# Patient Record
Sex: Female | Born: 1980 | Race: White | Hispanic: No | Marital: Single | Smoking: Current every day smoker
Health system: Southern US, Community
[De-identification: ages and names within clinical notes are randomized; demographics above are authoritative.]

## PROBLEM LIST (undated history)

## (undated) HISTORY — PX: WISDOM TOOTH EXTRACTION: SHX21

---

## 2017-07-27 ENCOUNTER — Other Ambulatory Visit: Payer: Self-pay

## 2017-07-27 ENCOUNTER — Telehealth: Payer: Self-pay | Admitting: Emergency Medicine

## 2017-07-27 ENCOUNTER — Encounter: Payer: Self-pay | Admitting: Emergency Medicine

## 2017-07-27 ENCOUNTER — Ambulatory Visit
Admission: EM | Admit: 2017-07-27 | Discharge: 2017-07-27 | Disposition: A | Discharge status: Home / Self Care | Payer: Self-pay | Attending: Family Medicine | Admitting: Family Medicine

## 2017-07-27 DIAGNOSIS — L03211 Cellulitis of face: Secondary | ICD-10-CM

## 2017-07-27 DIAGNOSIS — K047 Periapical abscess without sinus: Secondary | ICD-10-CM

## 2017-07-27 MED ORDER — CEFAZOLIN SODIUM 1 G IJ SOLR
1.0000 g | Freq: Once | INTRAMUSCULAR | Status: AC
  Administered 2017-07-27: 1 g via INTRAMUSCULAR

## 2017-07-27 MED ORDER — PENICILLIN V POTASSIUM 500 MG PO TABS: 500.0000 mg | ORAL_TABLET | Freq: Four times a day (QID) | ORAL | 0 refills | Status: AC

## 2017-07-27 MED ORDER — PENICILLIN V POTASSIUM 500 MG PO TABS: 500.0000 mg | ORAL_TABLET | Freq: Four times a day (QID) | ORAL | 0 refills | Status: DC

## 2017-07-27 NOTE — ED Provider Notes (Signed)
[facility]    CSN: [id number] Arrival date & time: 07/27/17  0846     History   Chief Complaint Chief Complaint  Patient presents with  . Facial Swelling    left side    HPI Rhonda Avery is a 37 y.o. female.   37 yo female with a c/o left sided facial swelling and dental pain. Denies any fevers, chills, vision problems, drainage. Has h/o chronic poor dentition.   The history is provided by the patient.    History reviewed. No pertinent past medical history.  There are no active problems to display for this patient.   Past Surgical History:  Procedure Laterality Date  . WISDOM TOOTH EXTRACTION      OB History   None      Home Medications    Prior to Admission medications   Medication Sig Start Date End Date Taking? Authorizing Provider  levonorgestrel (MIRENA) 20 MCG/24HR IUD 1 each by Intrauterine route once.   Yes [provider]  penicillin v potassium (VEETID) 500 MG tablet Take 1 tablet (500 mg total) by mouth 4 (four) times daily. 07/27/17   Payton Mccallumonty, Ranveer Wahlstrom, MD    Family History History reviewed. No pertinent family history.  Social History Social History   Tobacco Use  . Smoking status: Current Every Day Smoker    Types: Cigarettes  . Smokeless tobacco: Never Used  Substance Use Topics  . Alcohol use: Never    Frequency: Never  . Drug use: Never     Allergies   Patient has no known allergies.   Review of Systems Review of Systems   Physical Exam Triage Vital Signs ED Triage Vitals  Enc Vitals Group     BP 07/27/17 0911 (!) 129/99     Pulse Rate 07/27/17 0911 87     Resp 07/27/17 0911 14     Temp 07/27/17 0911 98.1 F (36.7 C)     Temp Source 07/27/17 0911 Oral     SpO2 07/27/17 0911 100 %     Weight 07/27/17 0907 132 lb (59.9 kg)     Height 07/27/17 0907 5\' 3"  (1.6 m)     Head Circumference --      Peak Flow --      Pain Score 07/27/17 0907 7     Pain Loc --      Pain Edu? --      Excl. in GC? --     No data found.  Updated Vital Signs BP (!) 129/99 (BP Location: Right Arm)   Pulse 87   Temp 98.1 F (36.7 C) (Oral)   Resp 14   Ht 5\' 3"  (1.6 m)   Wt 132 lb (59.9 kg)   SpO2 100%   BMI 23.38 kg/m   Visual Acuity Right Eye Distance:   Left Eye Distance:   Bilateral Distance:    Right Eye Near:   Left Eye Near:    Bilateral Near:     Physical Exam  Constitutional: She appears well-developed and well-nourished. No distress.  HENT:  Mouth/Throat: Oropharynx is clear and moist. Abnormal dentition. Dental caries present. No oropharyngeal exudate, posterior oropharyngeal edema, posterior oropharyngeal erythema or tonsillar abscesses. No tonsillar exudate.  Left cheek erythema and edema  Skin: She is not diaphoretic.  Nursing note and vitals reviewed.    UC Treatments / Results  Labs (all labs ordered are listed, but only abnormal results are displayed) Labs Reviewed - No data to display  EKG None  Radiology No results found.  Procedures Procedures (including critical care time)  Medications Ordered in UC Medications  ceFAZolin (ANCEF) injection 1 g (1 g Intramuscular Given 07/27/17 1006)    Initial Impression / Assessment and Plan / UC Course  I have reviewed the triage vital signs and the nursing notes.  Pertinent labs & imaging results that were available during my care of the patient were reviewed by me and considered in my medical decision making (see chart for details).     Final Clinical Impressions(s) / UC Diagnoses   Final diagnoses:  Facial cellulitis  Dental abscess     Discharge Instructions     Warm compresses If symptoms get worse over the next 24 to 48 hours, recommend going to the hospital Emergency Department    ED Prescriptions    Medication Sig Dispense Auth. Provider   penicillin v potassium (VEETID) 500 MG tablet Take 1 tablet (500 mg total) by mouth 4 (four) times daily. 40 tablet Payton Mccallum, MD     1. diagnosis  reviewed with patient; patient given Ancef 1gm IM x 1  2. rx as per orders above; reviewed possible side effects, interactions, risks and benefits  3. Recommend supportive treatment with otc analgesics prn 4. Go to Emergency Department if no improvement in next 48 hours or if worsening symptoms  5. Follow-up prn    Controlled Substance Prescriptions [place] Controlled Substance Registry consulted? Not Applicable   Payton Mccallum, MD 07/27/17 1250

## 2017-07-27 NOTE — Discharge Instructions (Signed)
Warm compresses If symptoms get worse over the next 24 to 48 hours, recommend going to the hospital Emergency Department

## 2017-07-27 NOTE — ED Triage Notes (Signed)
Patient c/o left sided facial swelling that started yesterday.  Patient reports left sided dental pain.  Patient denies injury or fall. Patient denies fever or chills.

## 2017-11-02 ENCOUNTER — Other Ambulatory Visit: Payer: Self-pay

## 2017-11-02 ENCOUNTER — Emergency Department
Admission: EM | Admit: 2017-11-02 | Discharge: 2017-11-02 | Disposition: A | Discharge status: Home / Self Care | Payer: Self-pay | Attending: Emergency Medicine | Admitting: Emergency Medicine

## 2017-11-02 ENCOUNTER — Emergency Department: Payer: Self-pay

## 2017-11-02 DIAGNOSIS — F17210 Nicotine dependence, cigarettes, uncomplicated: Secondary | ICD-10-CM | POA: Insufficient documentation

## 2017-11-02 DIAGNOSIS — R112 Nausea with vomiting, unspecified: Secondary | ICD-10-CM | POA: Insufficient documentation

## 2017-11-02 DIAGNOSIS — R197 Diarrhea, unspecified: Secondary | ICD-10-CM | POA: Insufficient documentation

## 2017-11-02 DIAGNOSIS — R109 Unspecified abdominal pain: Secondary | ICD-10-CM | POA: Insufficient documentation

## 2017-11-02 LAB — LIPASE, BLOOD: Lipase: 22 U/L (ref 11–51)

## 2017-11-02 LAB — CBC
HCT: 45.6 % (ref 36.0–46.0)
HEMOGLOBIN: 15.2 g/dL — AB (ref 12.0–15.0)
MCH: 31.7 pg (ref 26.0–34.0)
MCHC: 33.3 g/dL (ref 30.0–36.0)
MCV: 95 fL (ref 80.0–100.0)
Platelets: 368 10*3/uL (ref 150–400)
RBC: 4.8 MIL/uL (ref 3.87–5.11)
RDW: 12.7 % (ref 11.5–15.5)
WBC: 12.6 10*3/uL — ABNORMAL HIGH (ref 4.0–10.5)
nRBC: 0 % (ref 0.0–0.2)

## 2017-11-02 LAB — COMPREHENSIVE METABOLIC PANEL
ALT: 23 U/L (ref 0–44)
ANION GAP: 11 (ref 5–15)
AST: 43 U/L — ABNORMAL HIGH (ref 15–41)
Albumin: 4.4 g/dL (ref 3.5–5.0)
Alkaline Phosphatase: 67 U/L (ref 38–126)
BUN: 13 mg/dL (ref 6–20)
CO2: 25 mmol/L (ref 22–32)
Calcium: 9.3 mg/dL (ref 8.9–10.3)
Chloride: 106 mmol/L (ref 98–111)
Creatinine, Ser: 0.77 mg/dL (ref 0.44–1.00)
GFR calc Af Amer: >60 mL/min (ref >60)
Glucose, Bld: 163 mg/dL — ABNORMAL HIGH (ref 70–99)
POTASSIUM: 4.2 mmol/L (ref 3.5–5.1)
Sodium: 142 mmol/L (ref 135–145)
TOTAL PROTEIN: 8.2 g/dL — AB (ref 6.5–8.1)
Total Bilirubin: 0.5 mg/dL (ref 0.3–1.2)

## 2017-11-02 LAB — URINALYSIS, COMPLETE (UACMP) WITH MICROSCOPIC
Bacteria, UA: NONE SEEN
Bilirubin Urine: NEGATIVE
GLUCOSE, UA: NEGATIVE mg/dL
HGB URINE DIPSTICK: NEGATIVE
KETONES UR: 5 mg/dL — AB
Leukocytes, UA: NEGATIVE
NITRITE: NEGATIVE
PROTEIN: 100 mg/dL — AB
Specific Gravity, Urine: 1.026 (ref 1.005–1.030)
pH: 7 (ref 5.0–8.0)

## 2017-11-02 LAB — POCT PREGNANCY, URINE: Preg Test, Ur: NEGATIVE

## 2017-11-02 MED ORDER — ONDANSETRON HCL 4 MG/2ML IJ SOLN
4.0000 mg | Freq: Once | INTRAMUSCULAR | Status: AC | PRN
  Administered 2017-11-02: 4 mg via INTRAVENOUS
  Filled 2017-11-02: qty 2

## 2017-11-02 MED ORDER — SODIUM CHLORIDE 0.9 % IV BOLUS
1000.0000 mL | Freq: Once | INTRAVENOUS | Status: AC
  Administered 2017-11-02: 1000 mL via INTRAVENOUS

## 2017-11-02 MED ORDER — METOCLOPRAMIDE HCL 10 MG PO TABS: 10.0000 mg | ORAL_TABLET | Freq: Three times a day (TID) | ORAL | 0 refills | Status: AC | PRN

## 2017-11-02 MED ORDER — ONDANSETRON HCL 4 MG/2ML IJ SOLN
4.0000 mg | Freq: Once | INTRAMUSCULAR | Status: AC
  Administered 2017-11-02: 4 mg via INTRAVENOUS
  Filled 2017-11-02: qty 2

## 2017-11-02 MED ORDER — METOCLOPRAMIDE HCL 5 MG/ML IJ SOLN
10.0000 mg | Freq: Once | INTRAMUSCULAR | Status: AC
  Administered 2017-11-02: 10 mg via INTRAVENOUS
  Filled 2017-11-02: qty 2

## 2017-11-02 NOTE — ED Notes (Signed)
Patient given sprite at this time.

## 2017-11-02 NOTE — ED Triage Notes (Addendum)
Pt c/o emesis since 3AM. Pt dry heaving at time of triage. Pt asked to stay NPO until seen by EDP. Pt pale. Pt alert and oriented X4, active, cooperative, pt in NAD. RR even and unlabored, color WNL.   Pt continues to drink fluids at time of triage.

## 2017-11-02 NOTE — Discharge Instructions (Addendum)
Return to the ER for abdominal pain especially pain located in the right lower part of your abdomen, fever, or vomiting that is not responsive to the anti nausea medicine. Otherwise follow up with primary care in 2 days for re-evaluation.

## 2017-11-02 NOTE — ED Provider Notes (Signed)
[facility] Emergency Department Provider Note  ____________________________________________  Time seen: Approximately 4:40 PM  I have reviewed the triage vital signs and the nursing notes.   HISTORY  Chief Complaint Emesis   HPI Rhonda Avery is a 37 y.o. female no significant past medical history who presents for evaluation of vomiting, diarrhea.  Patient reports that her symptoms started 3 AM.  She reports several episodes of nonbloody nonbilious emesis.  Has had one episode of watery diarrhea.  No hematemesis, coffee-ground emesis, melena, hematochezia.  She is complaining of mild intermittent cramping abdominal pain.  No pain at this time.  Has had chills but no fever.  No vaginal discharge, no dysuria or hematuria, no chest pain or shortness of breath.  PMH None - reviewed  Past Surgical History:  Procedure Laterality Date  . WISDOM TOOTH EXTRACTION      Prior to Admission medications   Medication Sig Start Date End Date Taking? Authorizing Provider  levonorgestrel (MIRENA) 20 MCG/24HR IUD 1 each by Intrauterine route once.    [provider]  metoCLOPramide (REGLAN) 10 MG tablet Take 1 tablet (10 mg total) by mouth every 8 (eight) hours as needed for up to 3 days for nausea. 11/02/17 11/05/17  Nita Sickle, MD  penicillin v potassium (VEETID) 500 MG tablet Take 1 tablet (500 mg total) by mouth 4 (four) times daily. 07/27/17   Payton Mccallum, MD    Allergies Patient has no known allergies.  No family history on file.  Social History Social History   Tobacco Use  . Smoking status: Current Every Day Smoker    Types: Cigarettes  . Smokeless tobacco: Never Used  Substance Use Topics  . Alcohol use: Never    Frequency: Never  . Drug use: Never    Review of Systems  Constitutional: Negative for fever. Eyes: Negative for visual changes. ENT: Negative for sore throat. Neck: No neck pain  Cardiovascular: Negative for chest  pain. Respiratory: Negative for shortness of breath. Gastrointestinal: + abdominal pain, vomiting and diarrhea. Genitourinary: Negative for dysuria. Musculoskeletal: Negative for back pain. Skin: Negative for rash. Neurological: Negative for headaches, weakness or numbness. Psych: No SI or HI  ____________________________________________   PHYSICAL EXAM:  VITAL SIGNS: ED Triage Vitals [11/02/17 1423]  Enc Vitals Group     BP      Pulse Rate 78     Resp 18     Temp (!) 97.4 F (36.3 C)     Temp Source Oral     SpO2 98 %     Weight 140 lb (63.5 kg)     Height 5\' 3"  (1.6 m)     Head Circumference      Peak Flow      Pain Score 4     Pain Loc      Pain Edu?      Excl. in GC?     Constitutional: Alert and oriented. Well appearing and in no apparent distress. HEENT:      Head: Normocephalic and atraumatic.         Eyes: Conjunctivae are normal. Sclera is non-icteric.       Mouth/Throat: Mucous membranes are moist.       Neck: Supple with no signs of meningismus. Cardiovascular: Regular rate and rhythm. No murmurs, gallops, or rubs. 2+ symmetrical distal pulses are present in all extremities. No JVD. Respiratory: Normal respiratory effort. Lungs are clear to auscultation bilaterally. No wheezes, crackles, or rhonchi.  Gastrointestinal: Soft,  non tender, and non distended with positive bowel sounds. No rebound or guarding. Musculoskeletal: Nontender with normal range of motion in all extremities. No edema, cyanosis, or erythema of extremities. Neurologic: Normal speech and language. Face is symmetric. Moving all extremities. No gross focal neurologic deficits are appreciated. Skin: Skin is warm, dry and intact. No rash noted. Psychiatric: Mood and affect are normal. Speech and behavior are normal.  ____________________________________________   LABS (all labs ordered are listed, but only abnormal results are displayed)  Labs Reviewed  COMPREHENSIVE METABOLIC PANEL -  Abnormal; Notable for the following components:      Result Value   Glucose, Bld 163 (*)    Total Protein 8.2 (*)    AST 43 (*)    All other components within normal limits  CBC - Abnormal; Notable for the following components:   WBC 12.6 (*)    Hemoglobin 15.2 (*)    All other components within normal limits  URINALYSIS, COMPLETE (UACMP) WITH MICROSCOPIC - Abnormal; Notable for the following components:   Color, Urine YELLOW (*)    APPearance HAZY (*)    Ketones, ur 5 (*)    Protein, ur 100 (*)    All other components within normal limits  LIPASE, BLOOD  POC URINE PREG, ED  POCT PREGNANCY, URINE   ____________________________________________  EKG  none  ____________________________________________  RADIOLOGY  I have personally reviewed the images performed during this visit and I agree with the Radiologist's read.   Interpretation by Radiologist:  Dg Abd 2 Views  Result Date: 11/02/2017 CLINICAL DATA:  Nausea and vomiting. EXAM: ABDOMEN - 2 VIEW COMPARISON:  No prior. FINDINGS: Soft tissue structures are unremarkable. Mild gastric distention. Stomach is fluid-filled. Several air-filled loops of nondilated small bowel noted. No free air. No acute bony abnormality identified. IUD noted the pelvis. Pelvic calcifications consistent phleboliths. IMPRESSION: 1.  Mild gastric distention.  Stomach is fluid-filled. 2. Several nondilated air-filled loops of small bowel. No bowel distention or free air. Electronically Signed   By: Maisie Fus  Register   On: 11/02/2017 15:49     ____________________________________________   PROCEDURES  Procedure(s) performed: None Procedures Critical Care performed:  None ____________________________________________   INITIAL IMPRESSION / ASSESSMENT AND PLAN / ED COURSE   37 y.o. female no significant past medical history who presents for evaluation of vomiting, diarrhea.  Patient is extremely well-appearing, no distress, she has normal vital  signs, abdomen is soft with no tenderness throughout and positive bowel sounds.  KUB with no evidence of SBO which I have very low suspicion considering the patient is having bowel movements.  This is most likely viral gastroenteritis.  She does have mild leukocytosis of 12.6 which will go with this diagnosis.  Remainder of her labs showed no acute findings.  Patient will be given IV fluids and Zofran.  She has no abdominal tenderness and a negative pregnancy test therefore low clinical suspicion of appendicitis, ectopic, pregnancy.    _________________________ 6:20 PM on 11/02/2017 -----------------------------------------  Serial abdominal exams with no tenderness.  Patient is tolerating p.o.  Will discharge home with antiemetics and close follow-up with primary care doctor.  Discussed return precautions for abdominal pain especially right lower or right upper quadrant, fever, or any signs of dehydration.   As part of my medical decision making, I reviewed the following data within the electronic MEDICAL RECORD NUMBER Nursing notes reviewed and incorporated, Labs reviewed , Old chart reviewed, Radiograph reviewed , Notes from prior ED visits and [place] Controlled  Substance Database    Pertinent labs & imaging results that were available during my care of the patient were reviewed by me and considered in my medical decision making (see chart for details).    ____________________________________________   FINAL CLINICAL IMPRESSION(S) / ED DIAGNOSES  Final diagnoses:  Nausea and vomiting      NEW MEDICATIONS STARTED DURING THIS VISIT:  ED Discharge Orders         Ordered    metoCLOPramide (REGLAN) 10 MG tablet  Every 8 hours PRN     11/02/17 1817           Note:  This document was prepared using Dragon voice recognition software and may include unintentional dictation errors.    Nita Sickle, MD 11/02/17 [phone]

## 2017-11-24 ENCOUNTER — Other Ambulatory Visit: Payer: Self-pay

## 2017-11-24 ENCOUNTER — Emergency Department
Admission: EM | Admit: 2017-11-24 | Discharge: 2017-11-24 | Disposition: A | Discharge status: Home / Self Care | Payer: Self-pay | Attending: Emergency Medicine | Admitting: Emergency Medicine

## 2017-11-24 ENCOUNTER — Encounter: Payer: Self-pay | Admitting: Emergency Medicine

## 2017-11-24 DIAGNOSIS — K0889 Other specified disorders of teeth and supporting structures: Secondary | ICD-10-CM

## 2017-11-24 DIAGNOSIS — K047 Periapical abscess without sinus: Secondary | ICD-10-CM | POA: Insufficient documentation

## 2017-11-24 DIAGNOSIS — Z79899 Other long term (current) drug therapy: Secondary | ICD-10-CM | POA: Insufficient documentation

## 2017-11-24 DIAGNOSIS — F17210 Nicotine dependence, cigarettes, uncomplicated: Secondary | ICD-10-CM | POA: Insufficient documentation

## 2017-11-24 MED ORDER — HYDROCODONE-ACETAMINOPHEN 5-325 MG PO TABS
1.0000 | ORAL_TABLET | ORAL | Status: AC
  Administered 2017-11-24: 1 via ORAL
  Filled 2017-11-24: qty 1

## 2017-11-24 MED ORDER — CLINDAMYCIN PHOSPHATE 300 MG/2ML IJ SOLN
INTRAMUSCULAR | Status: AC
  Filled 2017-11-24: qty 4

## 2017-11-24 MED ORDER — CLINDAMYCIN PHOSPHATE 600 MG/4ML IJ SOLN
600.0000 mg | Freq: Once | INTRAMUSCULAR | Status: AC
  Administered 2017-11-24: 600 mg via INTRAMUSCULAR

## 2017-11-24 MED ORDER — HYDROCODONE-ACETAMINOPHEN 5-325 MG PO TABS: 1.0000 | ORAL_TABLET | Freq: Four times a day (QID) | ORAL | 0 refills | Status: AC | PRN

## 2017-11-24 MED ORDER — CLINDAMYCIN HCL 300 MG PO CAPS: 300.0000 mg | ORAL_CAPSULE | Freq: Four times a day (QID) | ORAL | 0 refills | Status: AC

## 2017-11-24 NOTE — ED Provider Notes (Signed)
[facility] EMERGENCY DEPARTMENT Provider Note   CSN: [id number] Arrival date & time: 11/24/17  1959     History   Chief Complaint Chief Complaint  Patient presents with  . Dental Problem    HPI Rhonda Avery is a 37 y.o. female.  Presents to the emergency department for evaluation of dental pain and swelling.  Patient has had pain and swelling since this morning.  She describes a soft tissue swelling to the right side of the face with toothache that began early this morning.  She is had a similar infection that began in the left upper area of her mouth but today is in the right lower region.  She does not have a Education officer, community.  She denies having insurance.  Last infection responded well to amoxicillin.  She does admit to having some mild drainage from the right lower tooth.  No trauma or injury.  No fevers.  Tolerating p.o. well.  HPI  History reviewed. No pertinent past medical history.  There are no active problems to display for this patient.   Past Surgical History:  Procedure Laterality Date  . WISDOM TOOTH EXTRACTION       OB History   None      Home Medications    Prior to Admission medications   Medication Sig Start Date End Date Taking? Authorizing Provider  clindamycin (CLEOCIN) 300 MG capsule Take 1 capsule (300 mg total) by mouth 4 (four) times daily for 10 days. 11/24/17 12/04/17  Evon Slack, PA-C  HYDROcodone-acetaminophen (NORCO) 5-325 MG tablet Take 1 tablet by mouth every 6 (six) hours as needed for moderate pain. 11/24/17   Evon Slack, PA-C  levonorgestrel (MIRENA) 20 MCG/24HR IUD 1 each by Intrauterine route once.    [provider]  metoCLOPramide (REGLAN) 10 MG tablet Take 1 tablet (10 mg total) by mouth every 8 (eight) hours as needed for up to 3 days for nausea. 11/02/17 11/05/17  Nita Sickle, MD  penicillin v potassium (VEETID) 500 MG tablet Take 1 tablet (500 mg total) by mouth 4 (four) times daily. 07/27/17    Payton Mccallum, MD    Family History No family history on file.  Social History Social History   Tobacco Use  . Smoking status: Current Every Day Smoker    Types: Cigarettes  . Smokeless tobacco: Never Used  Substance Use Topics  . Alcohol use: Never    Frequency: Never  . Drug use: Never     Allergies   Patient has no known allergies.   Review of Systems Review of Systems  Constitutional: Negative.  Negative for chills and fever.  HENT: Positive for dental problem and facial swelling. Negative for drooling, mouth sores, trouble swallowing and voice change.   Respiratory: Negative for shortness of breath.   Cardiovascular: Negative for chest pain.  Gastrointestinal: Negative for nausea and vomiting.  Musculoskeletal: Negative for arthralgias, neck pain and neck stiffness.  Skin: Negative.   Psychiatric/Behavioral: Negative for confusion.  All other systems reviewed and are negative.    Physical Exam Updated Vital Signs BP 120/85 (BP Location: Left Arm)   Pulse 89   Temp 98.3 F (36.8 C)   Resp 18   Ht 5\' 3"  (1.6 m)   Wt 61.7 kg   SpO2 99%   BMI 24.09 kg/m   Physical Exam  Constitutional: She is oriented to person, place, and time. She appears well-developed and well-nourished. No distress.  HENT:  Head: Normocephalic and atraumatic.  Right Ear: External ear normal.  Left Ear: External ear normal.  Nose: Nose normal.  Mouth/Throat: Uvula is midline and oropharynx is clear and moist. No oral lesions. No trismus in the jaw. Normal dentition. Dental abscesses and dental caries present. No uvula swelling.    No swelling or erythema under the tongue.  Eyes: EOM are normal.  Neck: Normal range of motion. Neck supple.  Cardiovascular: Normal rate. Exam reveals no gallop and no friction rub.  No murmur heard. Pulmonary/Chest: Effort normal and breath sounds normal. No respiratory distress.  Neurological: She is alert and oriented to person, place, and time.   Skin: Skin is warm and dry.  Psychiatric: She has a normal mood and affect. Her behavior is normal. Thought content normal.     ED Treatments / Results  Labs (all labs ordered are listed, but only abnormal results are displayed) Labs Reviewed - No data to display  EKG None  Radiology No results found.  Procedures Procedures (including critical care time)  Medications Ordered in ED Medications  clindamycin (CLEOCIN) injection 600 mg (has no administration in time range)  HYDROcodone-acetaminophen (NORCO/VICODIN) 5-325 MG per tablet 1 tablet (has no administration in time range)     Initial Impression / Assessment and Plan / ED Course  I have reviewed the triage vital signs and the nursing notes.  Pertinent labs & imaging results that were available during my care of the patient were reviewed by me and considered in my medical decision making (see chart for details).     37 year old female with normal vital signs right-sided facial swelling due to a right lower mandibular tooth abscess.  Mild drainage noted around the tooth.  Mild facial swelling noted along the right mandible region.  She is tolerating p.o. well.  She is given a dose of intramuscular clindamycin and will start p.o. clindamycin for 10 days.  She will call dental clinic provided on dental handout to schedule follow-up appointment.  She understands signs symptoms return to the ED for.  Final Clinical Impressions(s) / ED Diagnoses   Final diagnoses:  Pain, dental  Dental infection    ED Discharge Orders         Ordered    clindamycin (CLEOCIN) 300 MG capsule  4 times daily     11/24/17 2133    HYDROcodone-acetaminophen (NORCO) 5-325 MG tablet  Every 6 hours PRN     11/24/17 2133           Ronnette JuniperGaines, Ryka Beighley C, PA-C 11/24/17 2138    Jene EveryKinner, Robert, MD 11/24/17 2219

## 2017-11-24 NOTE — Discharge Instructions (Addendum)
Please swish gargle and spit hydrogen peroxide with tap water several times daily.  Take antibiotics as prescribed.  If any increasing pain, difficulty swallowing, increasing swelling or fevers return to the emergency department.  Please call dental clinic Monday morning.  Please see handout given in the emergency department for telephone number of numerous dental clinics in the county.

## 2017-11-24 NOTE — ED Notes (Signed)
Pt verbalized understanding of discharge instructions. NAD at this time. 

## 2017-11-24 NOTE — ED Notes (Signed)
Facial swelling r  Side face   started off as toothache  Swelling started today  Has  Not seen a dentist

## 2017-11-24 NOTE — ED Triage Notes (Signed)
Pt arrives ambulatory to triage with c/o right lower side jaw abscess. Pt has noted swelling to left jaw but is able to control secretions and is in NAD.

## 2019-11-06 IMAGING — CR DG ABDOMEN 2V
2 series · 2 of 2 positions shown · non-contrast
Comparison: No prior.

CLINICAL DATA: Nausea and vomiting.

EXAM:
ABDOMEN - 2 VIEW

[abdomen erect]
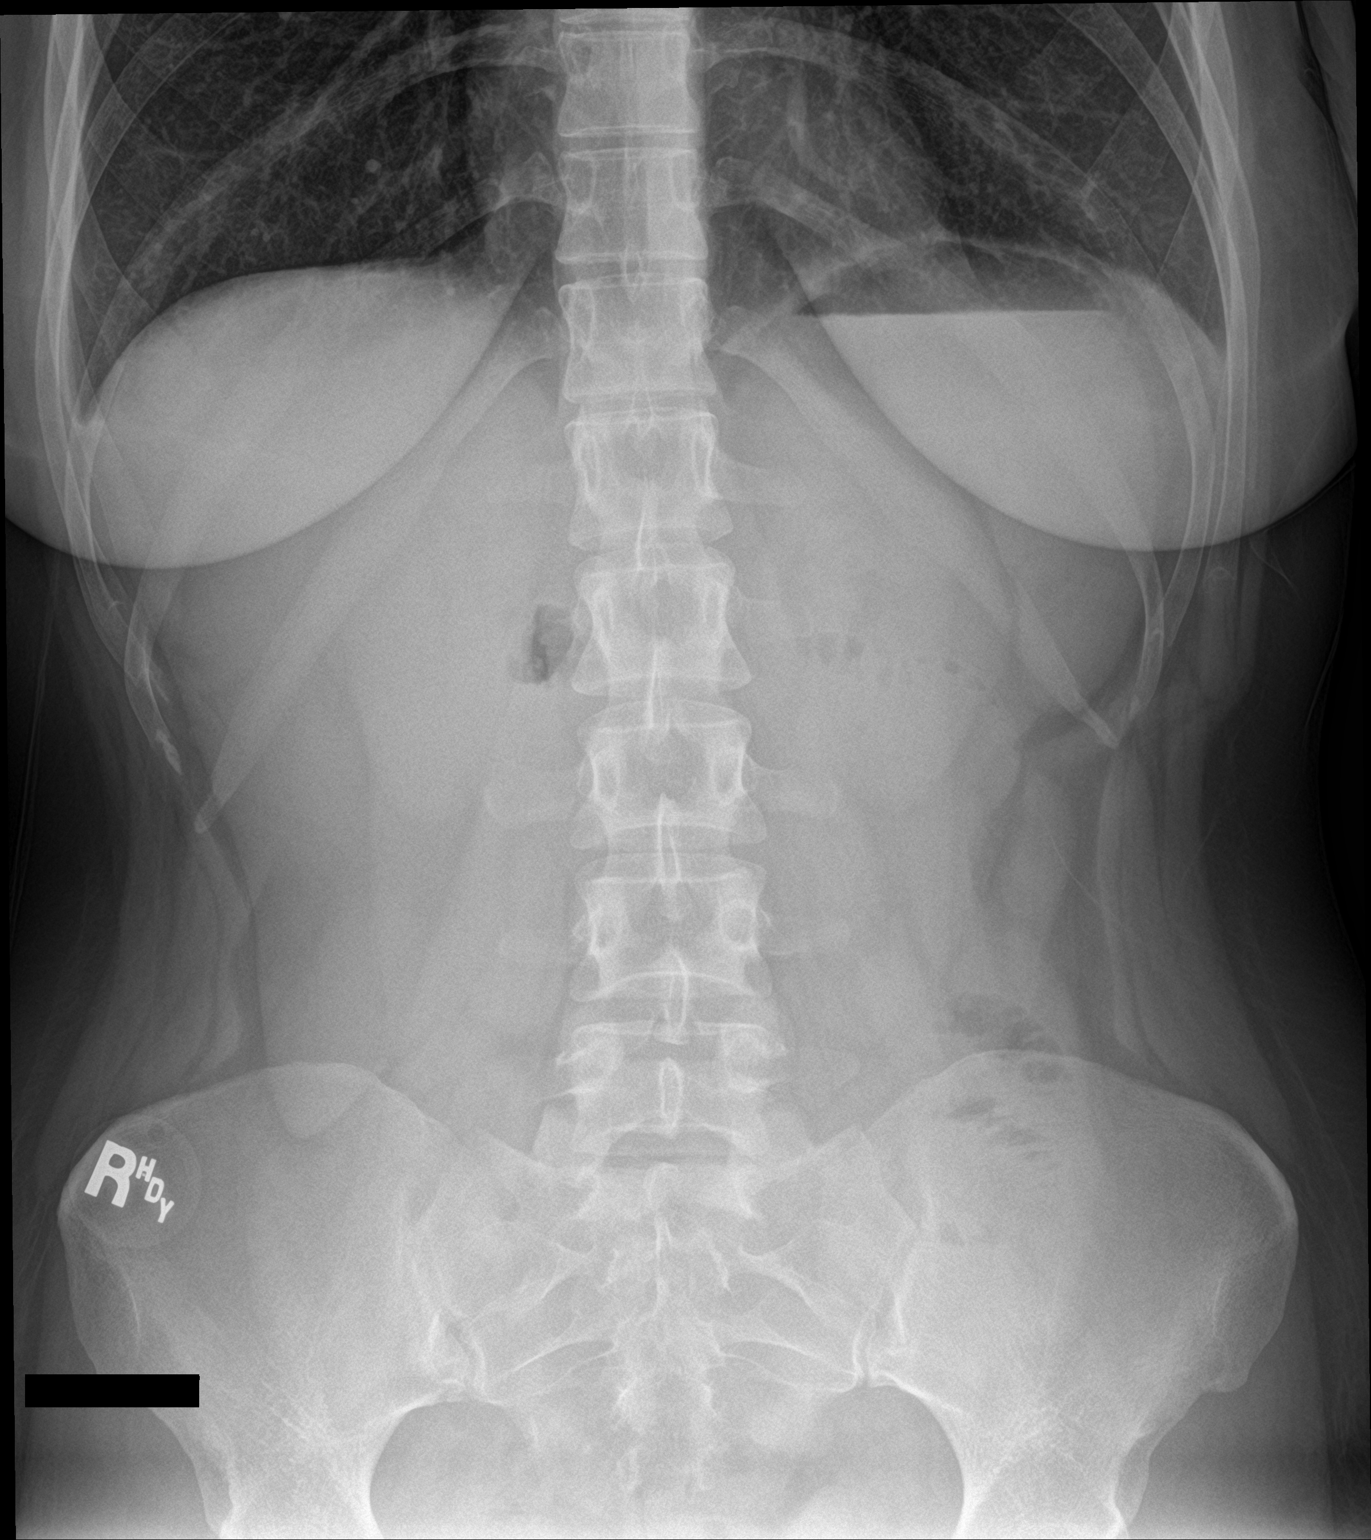

[abdomen supine]
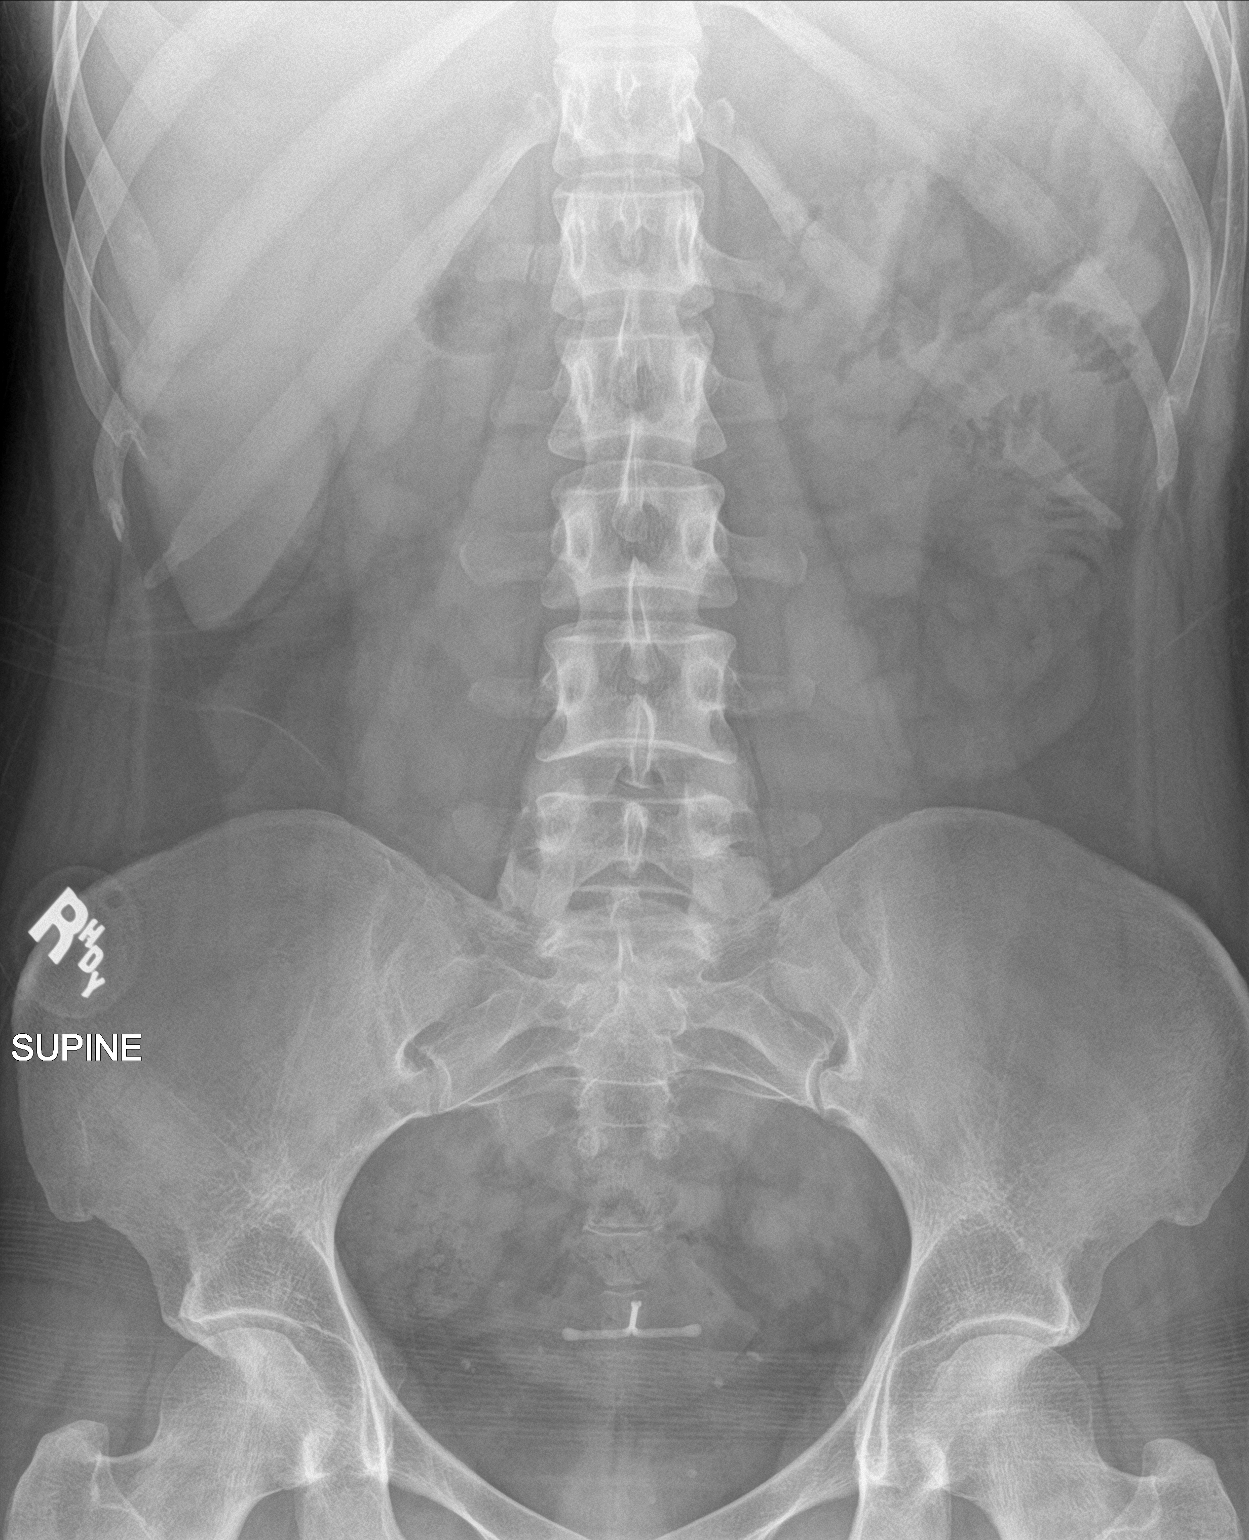

[2 of 2 positions shown; findings below may reference images not displayed]

FINDINGS: Soft tissue structures are unremarkable. Mild gastric distention.
Stomach is fluid-filled. Several air-filled loops of nondilated
small bowel noted. No free air. No acute bony abnormality
identified. IUD noted the pelvis. Pelvic calcifications consistent
phleboliths.
IMPRESSION: 1.  Mild gastric distention.  Stomach is fluid-filled.

2. Several nondilated air-filled loops of small bowel. No bowel
distention or free air.
